# Patient Record
Sex: Male | Born: 1975 | Race: Black or African American | Hispanic: No | Marital: Single | Smoking: Never smoker
Health system: Southern US, Community
[De-identification: ages and names within clinical notes are randomized; demographics above are authoritative.]

---

## 2004-06-12 ENCOUNTER — Emergency Department (HOSPITAL_COMMUNITY): Admission: EM | Admit: 2004-06-12 | Discharge: 2004-06-12 | Payer: Self-pay | Admitting: Emergency Medicine

## 2004-11-18 ENCOUNTER — Emergency Department (HOSPITAL_COMMUNITY): Admission: EM | Admit: 2004-11-18 | Discharge: 2004-11-18 | Payer: Self-pay | Admitting: Emergency Medicine
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
## 2016-02-22 ENCOUNTER — Encounter (HOSPITAL_BASED_OUTPATIENT_CLINIC_OR_DEPARTMENT_OTHER): Payer: Self-pay

## 2016-02-22 ENCOUNTER — Emergency Department (HOSPITAL_BASED_OUTPATIENT_CLINIC_OR_DEPARTMENT_OTHER)
Admission: EM | Admit: 2016-02-22 | Discharge: 2016-02-22 | Disposition: A | Discharge status: Home / Self Care | Payer: Self-pay | Attending: Emergency Medicine | Admitting: Emergency Medicine

## 2016-02-22 DIAGNOSIS — J069 Acute upper respiratory infection, unspecified: Secondary | ICD-10-CM | POA: Insufficient documentation

## 2016-02-22 LAB — RAPID STREP SCREEN (MED CTR MEBANE ONLY): Streptococcus, Group A Screen (Direct): NEGATIVE

## 2016-02-22 MED ORDER — BENZONATATE 100 MG PO CAPS: 100.0000 mg | ORAL_CAPSULE | Freq: Three times a day (TID) | ORAL | Status: DC

## 2016-02-22 NOTE — ED Notes (Signed)
C/o prod cough, chills, sore throat since last wednesday-NAD-steady gait

## 2016-02-22 NOTE — Discharge Instructions (Signed)
1. Medications: flonase, mucinex, tessalon, usual home medications 2. Treatment: rest, drink plenty of fluids, take tylenol or ibuprofen for fever control if needed 3. Follow Up: Please follow up with your primary doctor in 3 days for discussion of your diagnoses and further evaluation after today's visit; Return to the ER for high fevers, difficulty breathing or other concerning symptoms

## 2016-02-22 NOTE — ED Provider Notes (Signed)
CSN: [id number]     Arrival date & time 02/22/16  1534 History   First MD Initiated Contact with Patient 02/22/16 [phone]     Chief Complaint  Patient presents with  . Cough     (Consider location/radiation/quality/duration/timing/severity/associated sxs/prior Treatment) Patient is a 40 y.o. male presenting with cough. The history is provided by the patient and medical records. No language interpreter was used.  Cough Associated symptoms: fever (Resolved.) and sore throat   Associated symptoms: no chest pain and no shortness of breath    Bryan Mooney is a 40 y.o. male  with no pertinent PMH who presents to the Emergency Department complaining of persistent sore throat x 5 days. Associated symptoms include nasal congestion, productive cough, and subjective fever over the weekend which has since resolved. No medications taken PTA. No aggravating or alleviating factors noted.   History reviewed. No pertinent past medical history. History reviewed. No pertinent past surgical history. No family history on file. Social History  Substance Use Topics  . Smoking status: Never Smoker   . Smokeless tobacco: None  . Alcohol Use: Yes     Comment: weekly    Review of Systems  Constitutional: Positive for fever (Resolved.).  HENT: Positive for congestion and sore throat.   Respiratory: Positive for cough. Negative for shortness of breath.   Cardiovascular: Negative for chest pain, palpitations and leg swelling.  Gastrointestinal: Negative for nausea, vomiting and abdominal pain.      Allergies  Review of patient's allergies indicates no known allergies.  Home Medications   Prior to Admission medications   Medication Sig Start Date End Date Taking? Authorizing Provider  benzonatate (TESSALON) 100 MG capsule Take 1 capsule (100 mg total) by mouth every 8 (eight) hours. 02/22/16   Jaime Pilcher Ward, PA-C   BP 116/82 mmHg  Pulse 108  Temp(Src) 98.9 F (37.2 C) (Oral)  Resp 18  Ht 6\' 2"   (1.88 m)  Wt 108.863 kg  BMI 30.80 kg/m2  SpO2 100% Physical Exam  Constitutional: He is oriented to person, place, and time. He appears well-developed and well-nourished. No distress.  HENT:  Head: Normocephalic and atraumatic.  OP with erythema and tonsillar hypertrophy, no exudates. + nasal congestion with mucosal edema.   Neck: Normal range of motion. Neck supple.  No meningeal signs.   Cardiovascular: Normal rate, regular rhythm and normal heart sounds.   Pulmonary/Chest: Effort normal.  Lungs are clear to auscultation bilaterally - no w/r/r  Abdominal: Soft. He exhibits no distension. There is no tenderness.  Musculoskeletal: Normal range of motion.  Neurological: He is alert and oriented to person, place, and time.  Skin: Skin is warm and dry. He is not diaphoretic.  Cap refill < 3 seconds.   Nursing note and vitals reviewed.   ED Course  Procedures (including critical care time) Labs Review Labs Reviewed  RAPID [facility])  CULTURE, GROUP A STREP [facility])    Imaging Review No results found. I have personally reviewed and evaluated these images and lab results as part of my medical decision-making.   EKG Interpretation None      MDM   Final diagnoses:  URI (upper respiratory infection)   Bryan Mooney is afebrile, non-toxic appearing with a clear lung exam. Mild rhinorrhea and OP with erythema and tonsillar hypertrophy, no exudates. Rapid strep negative. Likely viral URI. Patient is agreeable to symptomatic treatment with close follow up with PCP as needed but spoke at length about emergent, changing, or  worsening of symptoms that should prompt return to ER. Patient voices understanding and is agreeable to plan.    [facility] Ward, PA-C 02/22/16 1646  Jerelyn Scott, MD 02/22/16 [phone]

## 2016-02-25 LAB — CULTURE, GROUP A STREP (THRC)

## 2018-12-05 ENCOUNTER — Ambulatory Visit (HOSPITAL_COMMUNITY)
Admission: EM | Admit: 2018-12-05 | Discharge: 2018-12-05 | Disposition: A | Discharge status: Home / Self Care | Payer: BLUE CROSS/BLUE SHIELD | Attending: Family Medicine | Admitting: Family Medicine

## 2018-12-05 ENCOUNTER — Encounter (HOSPITAL_COMMUNITY): Payer: Self-pay

## 2018-12-05 DIAGNOSIS — J320 Chronic maxillary sinusitis: Secondary | ICD-10-CM | POA: Insufficient documentation

## 2018-12-05 MED ORDER — DOXYCYCLINE HYCLATE 100 MG PO CAPS: 100.0000 mg | ORAL_CAPSULE | Freq: Two times a day (BID) | ORAL | 0 refills | Status: DC

## 2018-12-05 MED ORDER — HYDROCODONE-HOMATROPINE 5-1.5 MG/5ML PO SYRP: 5.0000 mL | ORAL_SOLUTION | Freq: Four times a day (QID) | ORAL | 0 refills | Status: DC | PRN

## 2018-12-05 NOTE — ED Provider Notes (Signed)
[facility]    CSN: [id number] Arrival date & time: 12/05/18  1846     History   Chief Complaint Chief Complaint  Patient presents with  . URI    HPI Bryan Mooney is a 43 y.o. male.   2-week history of head congestion cough cough productive of green sputum now associated with some vomiting due to viscosity of the phelgm.  HPI  History reviewed. No pertinent past medical history.  There are no active problems to display for this patient.   History reviewed. No pertinent surgical history.     Home Medications    Prior to Admission medications   Medication Sig Start Date End Date Taking? Authorizing Provider  benzonatate (TESSALON) 100 MG capsule Take 1 capsule (100 mg total) by mouth every 8 (eight) hours. 02/22/16   Ward, Chase Picket, PA-C  doxycycline (VIBRAMYCIN) 100 MG capsule Take 1 capsule (100 mg total) by mouth 2 (two) times daily. 12/05/18   Frederica Kuster, MD  HYDROcodone-homatropine [facility]) 5-1.5 MG/5ML syrup Take 5 mLs by mouth every 6 (six) hours as needed for cough. 12/05/18   Frederica Kuster, MD    Family History History reviewed. No pertinent family history.  Social History Social History   Tobacco Use  . Smoking status: Never Smoker  . Smokeless tobacco: Never Used  Substance Use Topics  . Alcohol use: Yes    Comment: weekly  . Drug use: No     Allergies   Patient has no known allergies.   Review of Systems Review of Systems  Constitutional: Negative.   HENT: Positive for sinus pressure and sinus pain.   Respiratory: Positive for cough.   Gastrointestinal: Positive for vomiting.  All other systems reviewed and are negative.    Physical Exam Triage Vital Signs ED Triage Vitals  Enc Vitals Group     BP 12/05/18 1924 118/85     Pulse Rate 12/05/18 1924 (!) 109     Resp 12/05/18 1924 16     Temp 12/05/18 1924 98.6 F (37 C)     Temp Source 12/05/18 1924 Oral     SpO2 12/05/18 1924 99 %     Weight --    Height --      Head Circumference --      Peak Flow --      Pain Score 12/05/18 1923 8     Pain Loc --      Pain Edu? --      Excl. in GC? --    No data found.  Updated Vital Signs BP 118/85 (BP Location: Left Arm)   Pulse (!) 109   Temp 98.6 F (37 C) (Oral)   Resp 16   SpO2 99%   Visual Acuity Right Eye Distance:   Left Eye Distance:   Bilateral Distance:    Right Eye Near:   Left Eye Near:    Bilateral Near:     Physical Exam Constitutional:      Appearance: Normal appearance.  HENT:     Head: Normocephalic.     Right Ear: Tympanic membrane normal.     Left Ear: Tympanic membrane normal.     Nose: Congestion and rhinorrhea present.     Mouth/Throat:     Pharynx: Oropharynx is clear.  Cardiovascular:     Rate and Rhythm: Normal rate and regular rhythm.     Heart sounds: Normal heart sounds.  Pulmonary:     Effort: Pulmonary effort is normal.  Breath sounds: Normal breath sounds.  Neurological:     General: No focal deficit present.     Mental Status: He is alert and oriented to person, place, and time.      UC Treatments / Results  Labs (all labs ordered are listed, but only abnormal results are displayed) Labs Reviewed - No data to display  EKG None  Radiology No results found.  Procedures Procedures (including critical care time)  Medications Ordered in UC Medications - No data to display  Initial Impression / Assessment and Plan / UC Course  I have reviewed the triage vital signs and the nursing notes.  Pertinent labs & imaging results that were available during my care of the patient were reviewed by me and considered in my medical decision making (see chart for details).     Sinusitis.  Treat with Doxy and Mucinex Final Clinical Impressions(s) / UC Diagnoses   Final diagnoses:  Chronic maxillary sinusitis     Discharge Instructions     Take Mucinex in daytime   ED Prescriptions    Medication Sig Dispense Auth. Provider     doxycycline (VIBRAMYCIN) 100 MG capsule Take 1 capsule (100 mg total) by mouth 2 (two) times daily. 20 capsule Frederica Kuster, MD   HYDROcodone-homatropine [facility]) 5-1.5 MG/5ML syrup Take 5 mLs by mouth every 6 (six) hours as needed for cough. 120 mL Frederica Kuster, MD     Controlled Substance Prescriptions [place] Controlled Substance Registry consulted? Yes, I have consulted the [place] Controlled Substances Registry for this patient, and feel the risk/benefit ratio today is favorable for proceeding with this prescription for a controlled substance.   Frederica Kuster, MD 12/05/18 [phone]

## 2018-12-05 NOTE — ED Triage Notes (Deleted)
The patient presented to the [facility] with a complaint of lower back and abdominal pain with urinary frequency x 1 week.

## 2018-12-05 NOTE — Discharge Instructions (Signed)
Take Mucinex in daytime

## 2018-12-05 NOTE — ED Triage Notes (Signed)
Pt presents with persistent cough for over 2 weeks with greenish-yellow & brown thick mucus, pt is also experiencing shortness of breath and chest tightness.  Pt is also vomiting thick mucus.

## 2019-12-13 ENCOUNTER — Encounter (HOSPITAL_COMMUNITY): Payer: Self-pay | Admitting: Emergency Medicine

## 2019-12-13 ENCOUNTER — Ambulatory Visit (HOSPITAL_COMMUNITY)
Admission: EM | Admit: 2019-12-13 | Discharge: 2019-12-13 | Disposition: A | Discharge status: Home / Self Care | Payer: BC Managed Care – PPO | Attending: Family Medicine | Admitting: Family Medicine

## 2019-12-13 ENCOUNTER — Other Ambulatory Visit: Payer: Self-pay

## 2019-12-13 DIAGNOSIS — M25551 Pain in right hip: Secondary | ICD-10-CM | POA: Diagnosis not present

## 2019-12-13 DIAGNOSIS — S39012A Strain of muscle, fascia and tendon of lower back, initial encounter: Secondary | ICD-10-CM

## 2019-12-13 MED ORDER — NAPROXEN 500 MG PO TABS: 500.0000 mg | ORAL_TABLET | Freq: Two times a day (BID) | ORAL | 0 refills | Status: AC

## 2019-12-13 MED ORDER — TIZANIDINE HCL 4 MG PO TABS: 4.0000 mg | ORAL_TABLET | Freq: Four times a day (QID) | ORAL | 0 refills | Status: AC | PRN

## 2019-12-13 MED ORDER — PANTOPRAZOLE SODIUM 40 MG PO TBEC: 40.0000 mg | DELAYED_RELEASE_TABLET | Freq: Every day | ORAL | 0 refills | Status: AC

## 2019-12-13 NOTE — ED Provider Notes (Signed)
[facility]    CSN: [id number] Arrival date & time: 12/13/19  1613      History   Chief Complaint Chief Complaint  Patient presents with  . Hip Pain    HPI Bryan Mooney is a 44 y.o. male.   HPI  Patient has a history of hospitalization of right hip pain in June 2020. That hospitalization is reviewed along with the x-rays and laboratory results. He was treated with naproxen and Protonix. He was also given Lipitor which he did not continue to take. Etiology of his pain was not clearly established, he had SI pain and swelling throughout the pelvic region as well as early osteoarthritis of the right hip. 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 When he was doing this couple of days ago he twisted and felt a pain in his right hip again. He points to his right SI region. He states this is his usual job activity, he did not fall or have any slip or trip. He states he has been having hip pain ever since then. He is here to get medication, hoping he does not end up in the hospital again.  History reviewed. No pertinent past medical history.  There are no problems to display for this patient.   History reviewed. No pertinent surgical history.     Home Medications    Prior to Admission medications   Medication Sig Start Date End Date Taking? Authorizing Provider  naproxen (NAPROSYN) 500 MG tablet Take 1 tablet (500 mg total) by mouth 2 (two) times daily. 12/13/19   Eustace Moore, MD  pantoprazole (PROTONIX) 40 MG tablet Take 1 tablet (40 mg total) by mouth daily. 12/13/19   Eustace Moore, MD  tiZANidine (ZANAFLEX) 4 MG tablet Take 1-2 tablets (4-8 mg total) by mouth every 6 (six) hours as needed for muscle spasms. 12/13/19   Eustace Moore, MD    Family History No family history on file.  Social History Social History   Tobacco Use  . Smoking status: Never Smoker  . Smokeless tobacco: Never Used  Substance Use Topics  . Alcohol use: Yes     Comment: weekly  . Drug use: No     Allergies   Patient has no known allergies.   Review of Systems Review of Systems  Musculoskeletal: Positive for arthralgias, back pain and gait problem.     Physical Exam Triage Vital Signs ED Triage Vitals  Enc Vitals Group     BP 12/13/19 1637 136/90     Pulse Rate 12/13/19 1637 98     Resp 12/13/19 1637 18     Temp 12/13/19 1637 98.6 F (37 C)     Temp src --      SpO2 12/13/19 1637 98 %     Weight --      Height --      Head Circumference --      Peak Flow --      Pain Score 12/13/19 1638 5     Pain Loc --      Pain Edu? --      Excl. in GC? --    No data found.  Updated Vital Signs BP 136/90   Pulse 98   Temp 98.6 F (37 C)   Resp 18   SpO2 98%   Visual Acuity Right Eye Distance:   Left Eye Distance:   Bilateral Distance:    Right Eye Near:   Left Eye Near:  Bilateral Near:     Physical Exam Constitutional:      General: He is not in acute distress.    Appearance: He is well-developed.     Comments: Appears uncomfortable. Antalgic gait.  HENT:     Head: Normocephalic and atraumatic.  Eyes:     Conjunctiva/sclera: Conjunctivae normal.     Pupils: Pupils are equal, round, and reactive to light.  Cardiovascular:     Rate and Rhythm: Normal rate.  Pulmonary:     Effort: Pulmonary effort is normal. No respiratory distress.  Musculoskeletal:        General: Normal range of motion.     Cervical back: Normal range of motion.     Comments: Tenderness over the right SI joint. No tenderness over the greater trochanter or anterior hip. Pain with hip flexion extension and internal rotation. No leg length discrepancy.  Skin:    General: Skin is warm and dry.  Neurological:     General: No focal deficit present.     Mental Status: He is alert.  Psychiatric:        Mood and Affect: Mood normal.        Behavior: Behavior normal.      UC Treatments / Results  Labs (all labs ordered are listed, but only  abnormal results are displayed) Labs Reviewed - No data to display  EKG   Radiology No results found.  Procedures Procedures (including critical care time)  Medications Ordered in UC Medications - No data to display  Initial Impression / Assessment and Plan / UC Course  I have reviewed the triage vital signs and the nursing notes.  Pertinent labs & imaging results that were available during my care of the patient were reviewed by me and considered in my medical decision making (see chart for details).     *I told patient that this may not be a compensable work injury. Aches and pains, with usual course of work activity are usually not compensable. Especially when he has a pre-existing hip arthritis condition. I will treat him with anti-inflammatories, as he was treated last time successfully. Giving him a couple days off work. Follow-up if he fails to improve Final Clinical Impressions(s) / UC Diagnoses   Final diagnoses:  Right hip pain  Sacroiliac strain, initial encounter     Discharge Instructions     Take naproxen 2 times a day with food  This is an anti-inflammatory pain medicine Take the tizanidine as needed.  This is a muscle relaxer.  This is helpful at bedtime Take Protonix once a day.  This protects your stomach from any upset Activity as tolerated.  Move about with light activity but no heavy lifting or twisting Warm compresses to low back and hip See a primary care doctor for a checkup and for follow-up   ED Prescriptions    Medication Sig Dispense Auth. Provider   naproxen (NAPROSYN) 500 MG tablet Take 1 tablet (500 mg total) by mouth 2 (two) times daily. 30 tablet Raylene Everts, MD   tiZANidine (ZANAFLEX) 4 MG tablet Take 1-2 tablets (4-8 mg total) by mouth every 6 (six) hours as needed for muscle spasms. 21 tablet Raylene Everts, MD   pantoprazole (PROTONIX) 40 MG tablet Take 1 tablet (40 mg total) by mouth daily. 15 tablet Raylene Everts, MD      PDMP not reviewed this encounter.   Raylene Everts, MD 12/13/19 2115

## 2019-12-13 NOTE — ED Triage Notes (Signed)
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

## 2019-12-13 NOTE — Discharge Instructions (Signed)
Take naproxen 2 times a day with food  This is an anti-inflammatory pain medicine Take the tizanidine as needed.  This is a muscle relaxer.  This is helpful at bedtime Take Protonix once a day.  This protects your stomach from any upset Activity as tolerated.  Move about with light activity but no heavy lifting or twisting Warm compresses to low back and hip See a primary care doctor for a checkup and for follow-up
